# Patient Record
Sex: Female | Born: 1975 | Race: Black or African American | Hispanic: No | Marital: Single | Smoking: Never smoker
Health system: Southern US, Community
[De-identification: ages and names within clinical notes are randomized; demographics above are authoritative.]

## PROBLEM LIST (undated history)

## (undated) DIAGNOSIS — N390 Urinary tract infection, site not specified: Secondary | ICD-10-CM

## (undated) HISTORY — PX: TUBAL LIGATION: SHX77

## (undated) HISTORY — PX: OTHER SURGICAL HISTORY: SHX169

---

## 1997-11-11 ENCOUNTER — Encounter: Admission: RE | Admit: 1997-11-11 | Discharge: 1997-11-11 | Payer: Self-pay | Admitting: Sports Medicine

## 1998-03-21 ENCOUNTER — Inpatient Hospital Stay (HOSPITAL_COMMUNITY): Admission: AD | Admit: 1998-03-21 | Discharge: 1998-03-24 | Payer: Self-pay | Admitting: *Deleted

## 1998-11-05 ENCOUNTER — Emergency Department (HOSPITAL_COMMUNITY): Admission: EM | Admit: 1998-11-05 | Discharge: 1998-11-05 | Payer: Self-pay | Admitting: Emergency Medicine

## 1998-11-05 ENCOUNTER — Encounter: Payer: Self-pay | Admitting: Emergency Medicine

## 2001-06-12 ENCOUNTER — Other Ambulatory Visit: Admission: RE | Admit: 2001-06-12 | Discharge: 2001-06-12 | Payer: Self-pay | Admitting: Obstetrics and Gynecology

## 2001-11-13 ENCOUNTER — Inpatient Hospital Stay (HOSPITAL_COMMUNITY): Admission: AD | Admit: 2001-11-13 | Discharge: 2001-11-16 | Payer: Self-pay | Admitting: Obstetrics and Gynecology

## 2001-11-13 ENCOUNTER — Encounter (INDEPENDENT_AMBULATORY_CARE_PROVIDER_SITE_OTHER): Payer: Self-pay | Admitting: Specialist

## 2001-11-22 ENCOUNTER — Encounter: Payer: Self-pay | Admitting: Emergency Medicine

## 2001-11-22 ENCOUNTER — Inpatient Hospital Stay (HOSPITAL_COMMUNITY): Admission: EM | Admit: 2001-11-22 | Discharge: 2001-11-25 | Payer: Self-pay | Admitting: Emergency Medicine

## 2001-11-23 ENCOUNTER — Encounter: Payer: Self-pay | Admitting: Cardiology

## 2001-11-23 ENCOUNTER — Encounter: Payer: Self-pay | Admitting: *Deleted

## 2001-11-24 ENCOUNTER — Encounter: Payer: Self-pay | Admitting: Family Medicine

## 2001-11-25 ENCOUNTER — Encounter: Payer: Self-pay | Admitting: Family Medicine

## 2001-12-04 ENCOUNTER — Encounter: Admission: RE | Admit: 2001-12-04 | Discharge: 2001-12-04 | Payer: Self-pay | Admitting: Family Medicine

## 2002-06-27 ENCOUNTER — Emergency Department (HOSPITAL_COMMUNITY): Admission: EM | Admit: 2002-06-27 | Discharge: 2002-06-27 | Payer: Self-pay | Admitting: Emergency Medicine

## 2002-11-04 ENCOUNTER — Emergency Department (HOSPITAL_COMMUNITY): Admission: EM | Admit: 2002-11-04 | Discharge: 2002-11-04 | Payer: Self-pay | Admitting: Emergency Medicine

## 2002-11-05 ENCOUNTER — Emergency Department (HOSPITAL_COMMUNITY): Admission: EM | Admit: 2002-11-05 | Discharge: 2002-11-06 | Payer: Self-pay | Admitting: Emergency Medicine

## 2003-10-29 ENCOUNTER — Emergency Department (HOSPITAL_COMMUNITY): Admission: EM | Admit: 2003-10-29 | Discharge: 2003-10-30 | Payer: Self-pay

## 2003-11-10 ENCOUNTER — Ambulatory Visit (HOSPITAL_COMMUNITY): Admission: RE | Admit: 2003-11-10 | Discharge: 2003-11-10 | Payer: Self-pay | Admitting: Internal Medicine

## 2003-11-10 ENCOUNTER — Encounter: Admission: RE | Admit: 2003-11-10 | Discharge: 2003-11-10 | Payer: Self-pay | Admitting: Internal Medicine

## 2003-11-14 ENCOUNTER — Ambulatory Visit (HOSPITAL_COMMUNITY): Admission: RE | Admit: 2003-11-14 | Discharge: 2003-11-14 | Payer: Self-pay | Admitting: Internal Medicine

## 2003-11-20 ENCOUNTER — Encounter: Admission: RE | Admit: 2003-11-20 | Discharge: 2003-11-20 | Payer: Self-pay | Admitting: Internal Medicine

## 2004-02-24 ENCOUNTER — Ambulatory Visit: Payer: Self-pay | Admitting: Internal Medicine

## 2005-07-04 ENCOUNTER — Other Ambulatory Visit: Admission: RE | Admit: 2005-07-04 | Discharge: 2005-07-04 | Payer: Self-pay | Admitting: Family Medicine

## 2005-07-10 ENCOUNTER — Emergency Department (HOSPITAL_COMMUNITY): Admission: EM | Admit: 2005-07-10 | Discharge: 2005-07-11 | Payer: Self-pay | Admitting: Emergency Medicine

## 2005-07-13 ENCOUNTER — Emergency Department (HOSPITAL_COMMUNITY): Admission: EM | Admit: 2005-07-13 | Discharge: 2005-07-13 | Payer: Self-pay | Admitting: Emergency Medicine

## 2005-09-09 IMAGING — CR DG CHEST 2V
2 series · 2 of 2 positions shown · non-contrast
Comparison: none

CLINICAL DATA: Midchest pain. 
 PA AND LATERAL CHEST 
 The patient has taken a shallow inspiration which accentuates the cardiac silhouette.  Pulmonary vascularity is normal and the lungs are clear.  There is a mild thoracic scoliosis. 
 IMPRESSION
 Possible mild cardiomegaly.

[view not recorded (1 of 2)]
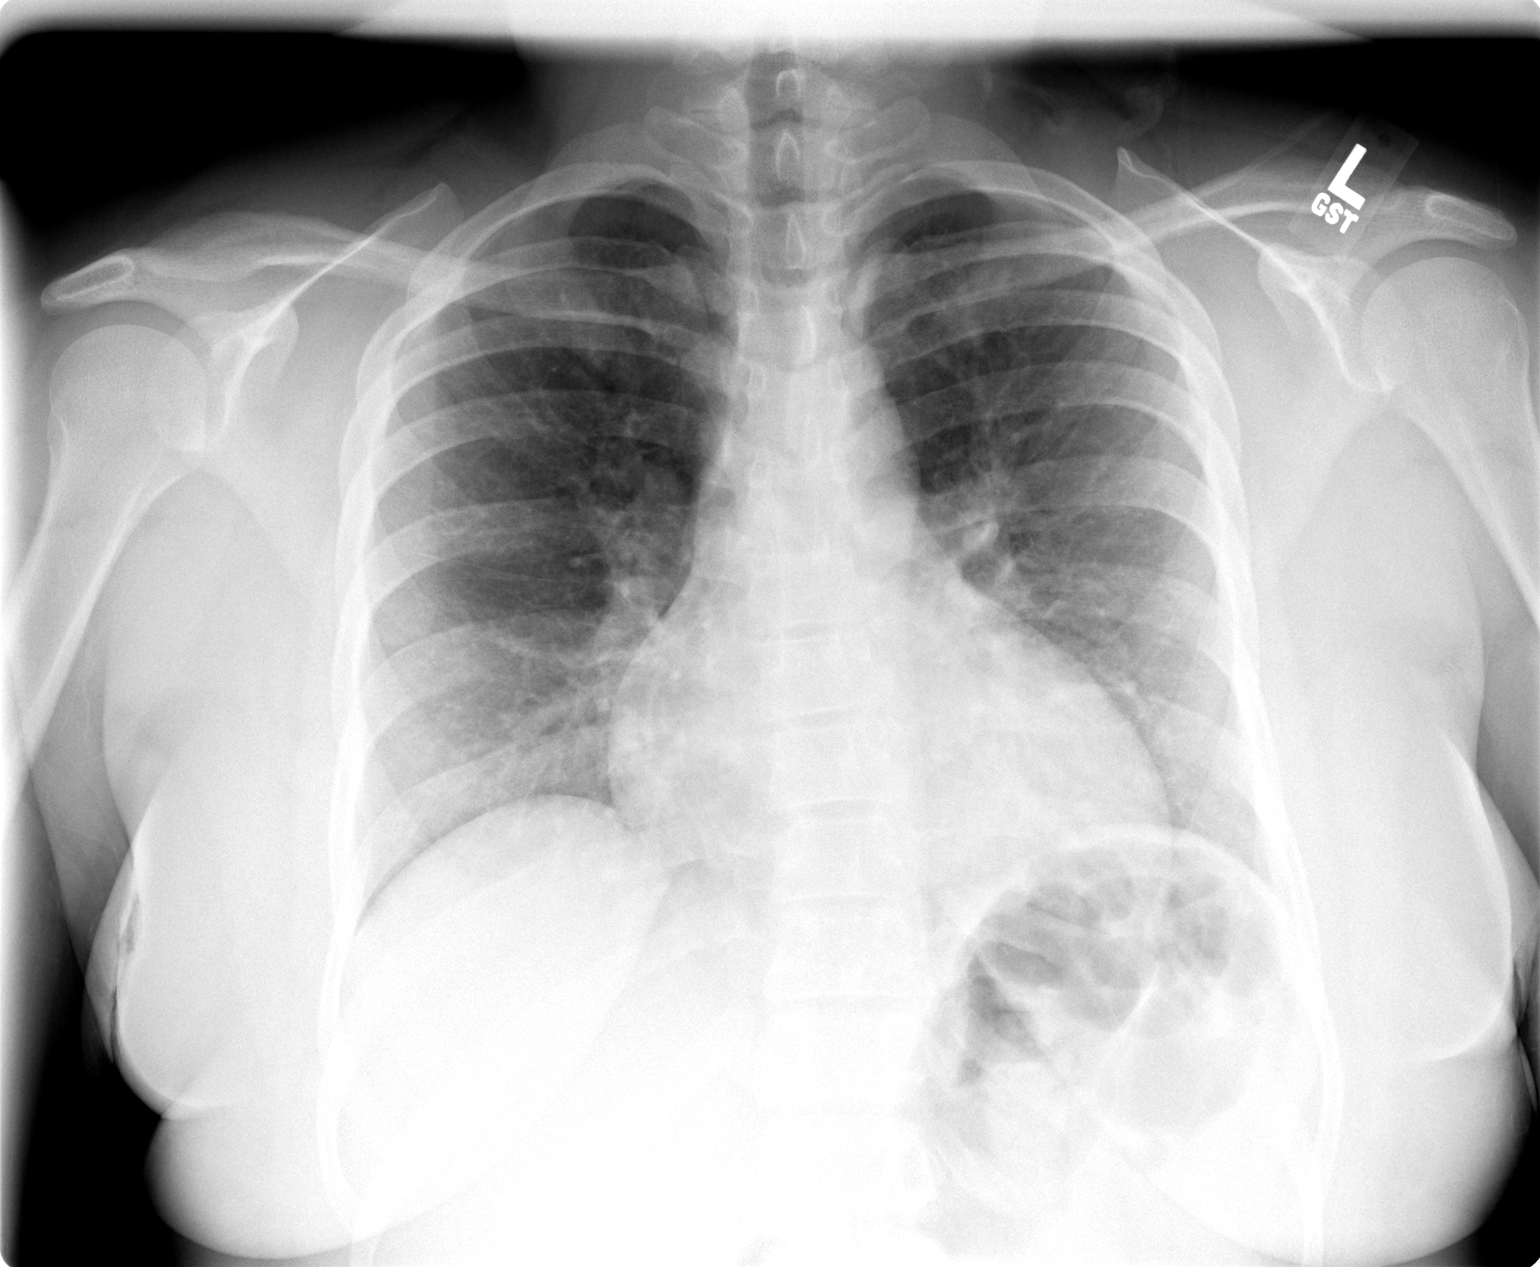

[view not recorded (2 of 2)]
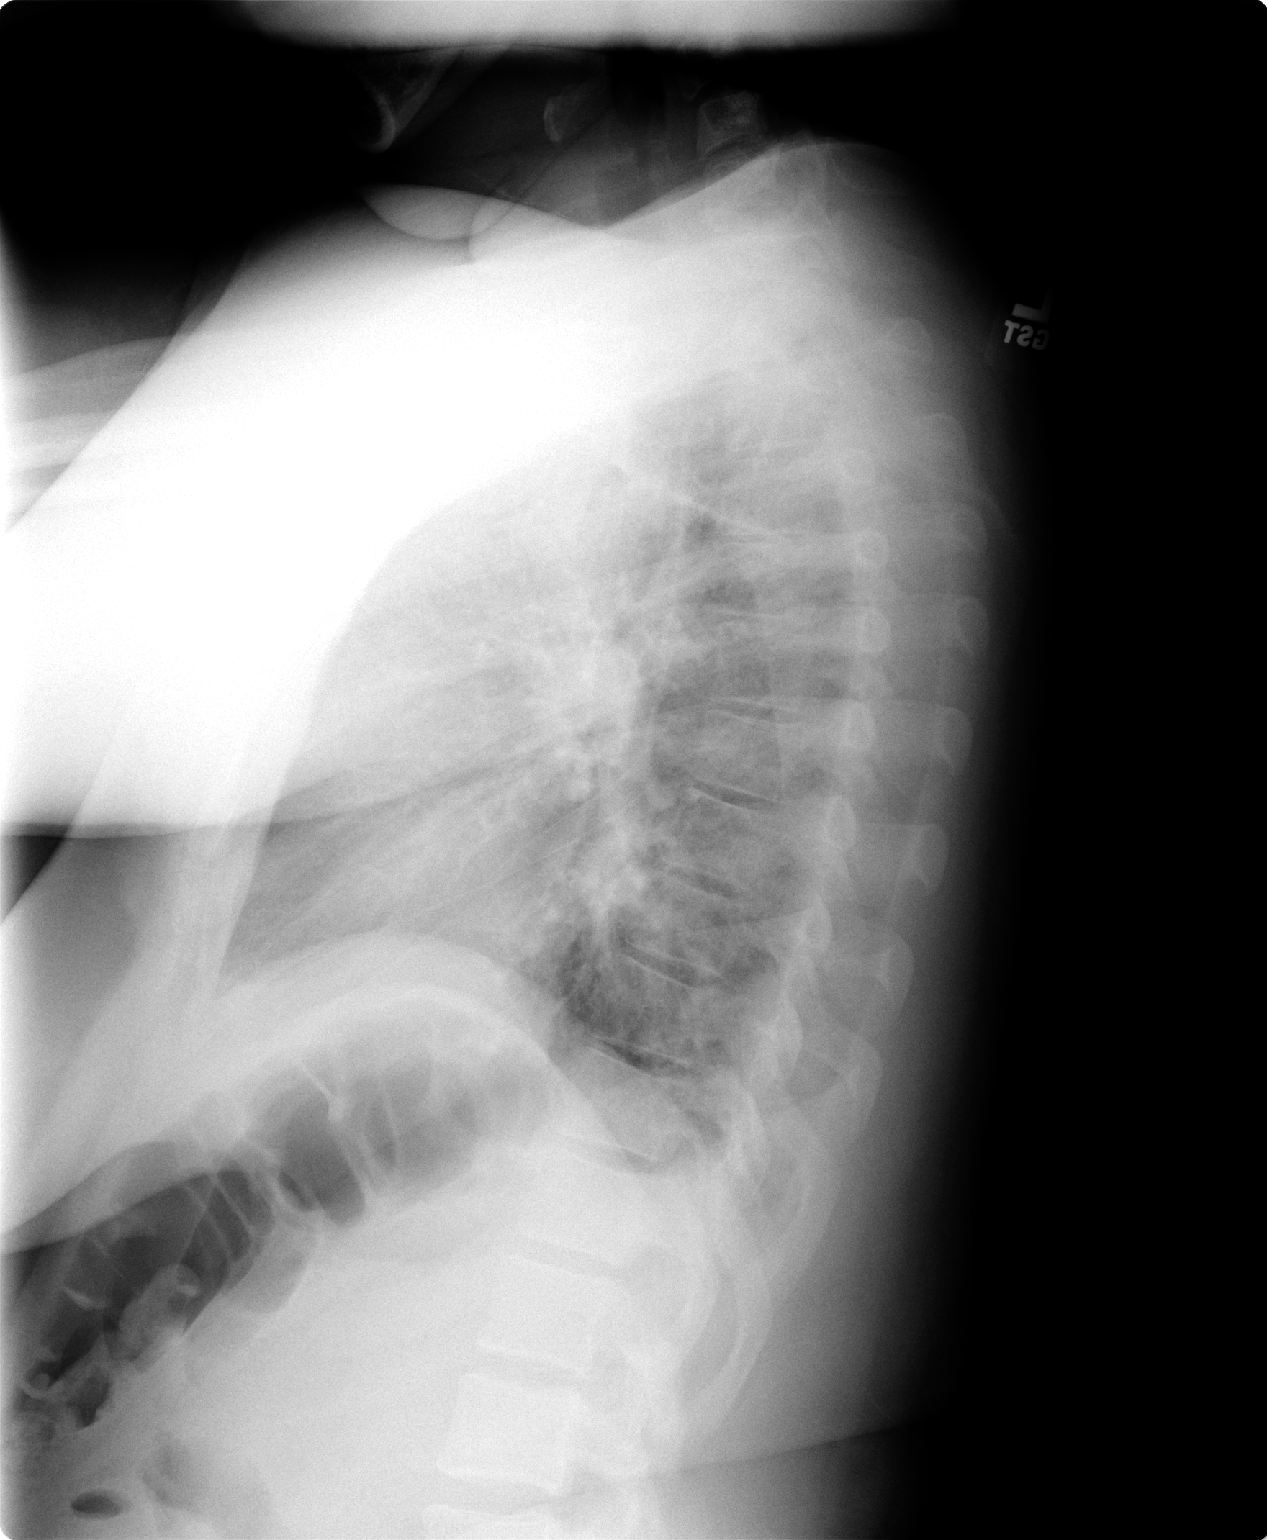

[2 of 2 positions shown; findings below may reference images not displayed]

## 2005-10-12 ENCOUNTER — Emergency Department (HOSPITAL_COMMUNITY): Admission: EM | Admit: 2005-10-12 | Discharge: 2005-10-13 | Payer: Self-pay | Admitting: Emergency Medicine

## 2006-05-15 ENCOUNTER — Emergency Department (HOSPITAL_COMMUNITY): Admission: EM | Admit: 2006-05-15 | Discharge: 2006-05-15 | Payer: Self-pay | Admitting: Emergency Medicine

## 2006-09-11 ENCOUNTER — Ambulatory Visit: Payer: Self-pay | Admitting: Internal Medicine

## 2006-10-30 ENCOUNTER — Ambulatory Visit: Payer: Self-pay | Admitting: Internal Medicine

## 2006-10-30 ENCOUNTER — Encounter: Payer: Self-pay | Admitting: Internal Medicine

## 2007-02-27 DIAGNOSIS — B359 Dermatophytosis, unspecified: Secondary | ICD-10-CM | POA: Insufficient documentation

## 2007-07-06 ENCOUNTER — Ambulatory Visit: Payer: Self-pay | Admitting: Internal Medicine

## 2007-07-06 DIAGNOSIS — N760 Acute vaginitis: Secondary | ICD-10-CM | POA: Insufficient documentation

## 2007-07-06 LAB — CONVERTED CEMR LAB: Whiff Test: NEGATIVE

## 2007-07-11 ENCOUNTER — Encounter (INDEPENDENT_AMBULATORY_CARE_PROVIDER_SITE_OTHER): Payer: Self-pay | Admitting: *Deleted

## 2007-07-11 LAB — CONVERTED CEMR LAB: GC Probe Amp, Genital: NEGATIVE

## 2007-08-09 ENCOUNTER — Emergency Department (HOSPITAL_COMMUNITY): Admission: EM | Admit: 2007-08-09 | Discharge: 2007-08-09 | Payer: Self-pay | Admitting: Emergency Medicine

## 2007-09-27 ENCOUNTER — Telehealth (INDEPENDENT_AMBULATORY_CARE_PROVIDER_SITE_OTHER): Payer: Self-pay | Admitting: Nurse Practitioner

## 2010-05-06 ENCOUNTER — Emergency Department (HOSPITAL_COMMUNITY): Admission: EM | Admit: 2010-05-06 | Discharge: 2009-10-11 | Payer: Self-pay | Admitting: Emergency Medicine

## 2010-08-16 LAB — URINE MICROSCOPIC-ADD ON

## 2010-08-16 LAB — URINALYSIS, ROUTINE W REFLEX MICROSCOPIC
Glucose, UA: NEGATIVE mg/dL
Ketones, ur: NEGATIVE mg/dL
Nitrite: NEGATIVE
Urobilinogen, UA: 1 mg/dL (ref 0.0–1.0)

## 2010-10-15 NOTE — Discharge Summary (Signed)
[place]  Patient:    Shirley Everett, Shirley Everett Visit Number: [id number] MRN: [id number]          Service Type: MED Location: 2000 2030 01 Attending Physician:  Tobin Chad Dictated by:   Nicole Cella, M.D. Admit Date:  11/22/2001 Discharge Date: 11/25/2001   CC:         Nilda Simmer, M.D.  Vanessa P. Pennie Rushing, M.D.   Discharge Summary  DATE OF BIRTH:  [birth year]  DISCHARGE DIAGNOSES: 1. Pulmonary edema, of unknown etiology, ruled out for pulmonary embolus,    postpartum cardiomyopathy, and rate-presenting preeclampsia. 2. Postpartum cesarean section on November 13, 2001, cesarean section done as    an elective repeat. 3. Bilateral tubal ligation done with her cesarean section.  Postoperative    wound infection of cesarean section site.  DISCHARGE MEDICATIONS:  Prenatal vitamins one q.d.  CONSULTING PHYSICIANS: 1. Cardiology, Lenise Herald, M.D.  PROCEDURE: 1. Echocardiogram.  LVEF was normal at 55 to 65%, left ventricular size was    normal.  There was no left ventricular wall thickness or motion    abnormalities.  Aortic valve and mitral valve were normal.  Aortic valve    was trileaflet.  There was read as a mild thickening of the mitral valve.    No noted mitral regurgitation.  Right ventricle normal.  Tricuspid valve    normal.  There was a minimal pericardial effusion without hemodynamic    compromise.  Overall study was read as normal.  This study was read on November 23, 2001. 2. CT of the chest and lower extremities was negative for pulmonary embolus,    although, there was significant pulmonary edema which was read as extensive    central interstitial and air space disease with more focal confluent air    space disease within the left upper lung, suggestive of moderate to severe    pulmonary edema.  There were bilateral pleural effusions.  Lower    extremities were negative for deep venous thromboses. 3. Pulmonary  angiogram.  The patient underwent a pulmonary angiogram which    was negative for pulmonary embolus.  FOLLOW-UP:  The patient is to follow up with Nilda Simmer, M.D.  This appointment is to be made over the phone next week.  HISTORY OF PRESENT ILLNESS:  This is a 35 year old African-American, gravida 2, para 2-0-0-2, who was delivered by elective repeat cesarean section on November 13, 2001, at [facility] [place].  She presented to the [facility] emergency department after being awakened from sleep on the morning of presentation with shortness of breath.  The patient sat upright and said the dyspnea improved, but continued to be somewhat short of breath. This was a sudden onset per the patient with no other complaints.  No fever or chills, no nausea and vomiting, no abdominal pain, no changes in bowel or bladder habits.  The patient is breastfeeding and was seen at her primary OB/GYN Office on June 25 for a wound check.  At that time she was diagnosed with a wound infection and was given a prescription for Keflex and advised to come back in a couple of days for a recheck on the wound.  PAST MEDICAL HISTORY:  Cesarean section x2.  No other serious illnesses or pregnancy complications.  MEDICATIONS:  Ibuprofen and Percocet.  ALLERGIES:  No known drug allergies.  SOCIAL HISTORY:  The patient lives with the father of her baby and  her two children.  She does not work outside the home.  No tobacco, alcohol, or drugs.  FAMILY HISTORY:  Negative for cardiac disease, pulmonary disease, hypertension, or diabetes.  PHYSICAL EXAMINATION:  VITAL SIGNS:  On presentation, the patient had a respiratory rate of 48 and was saturating 94% on 4 liters by nasal cannula. Blood pressure was 166/86, heart rate 66, temperature 99.0.  GENERAL: The patient was sitting upright in bed with shallow, rapid respirations, in mild to moderate respiratory distress.  She was able to speak in  short sentences and appeared tired and pale.  NECK:  No JVD, no bruits, and no thyromegaly. CHEST:  No breath sounds at the bases and bilateral rales most of the way up the patients both lung fields.  ABDOMEN: Soft and nontender.  There were positive bowel sounds.  Incision was without drainage or purulent material, however, the left side of the wound had not completely healed.  EXTREMITIES: 1+ edema, warm, good pulses.  LABORATORY DATA:  Presenting white count 7.4, hemoglobin 10.9, platelets 283. CK 141, MB 0.8, relative index 0.6, troponin I 0.01.  D-dimer was 18.11. Sodium 142, potassium 3.8, chloride 112, bicarb 23, BUN 11, creatinine 0.3, glucose 89, albumin 2.4.  Blood gas showed a pH of 7.45, pCO2 34, and pO2 59. Chest x-ray showed cardiomegaly with pulmonary edema.  CT of chest was as listed above.  At the time, the patient was admitted as acute pulmonary edema, rule out postpartum cardiomyopathy.  HOSPITAL COURSE:  #1 - Pulmonary edema.  Cardiology was initially consulted and agreed that a postpartum cardiomyopathy needed to be ruled out.  Therefore, the patient had an echocardiogram done and a brain naturetic protein which was found to be less than 30.  On the basis of the echocardiogram and the normal BNP it was thought that this was not a postpartum cardiomyopathy, although, fluid retention and a transient diastolic function was mentioned by cardiology as a possible etiology for her pulmonary edema.  It was their recommendation that the patient did not need to be kept on cardiac medications such as Lasix and ACE inhibitor as an outpatient, as this appeared to be a transient phenomena. A preeclampsia labs were done on the patient to rule out the possibility of a late presenting preeclampsia with pulmonary edema, but as the patients liver enzymes were normal, her urine was without protein and her blood pressures  remained normal.  It was felt that this was an unlikely  etiology, although, of note, the patient did have a uric acid level of 8.5 which was elevated.  Her LDH, however, was normal at 241.  Peripheral blood smear also was normal.  The patient was aggressively diuresed over the course of the hospitalization with Lasix and rapidly improved.  On the day of discharge, the patient was not requiring oxygen, was not feeling short of breath, and seemed to have returned to her baseline.  Due to this and the fact that no specific abnormalities had been found, a pulmonary angiogram was done to verify that she did not have a pulmonary embolus contributing to this and as all of these studies were normal, it was decided to discharge her home on no cardiac medications, but with close follow up with primary care physician.  Of note, it should be kept in differential that the patient might have had some sort of mild acute lung injury such as a small amniotic fluid embolus which might have caused her to have some diffuse lung dysfunction  which possibly could have contributed to this picture.  #2 - Wound infection.  The patients primary OB/GYN had written to put her on Keflex 500 mg q.i.d. for a wound infection, but she had not been put on any sort of antibiotics during the course of this hospitalization and the wound seems to be doing well with just topical therapy, so on discharge she was not prescribed any antibiotics.  If the wound does not heal in the next couple of days, I think this would be indicated.  #3 - Postpartum.  The patient was stable postpartum and continued to use breastpump during her hospitalization. Dictated by:   Nicole Cella, M.D. Attending Physician:  Tobin Chad DD:  11/25/01 TD:  11/26/01 Job: [id number]

## 2010-10-15 NOTE — Discharge Summary (Signed)
[facility]  Patient:    Shirley Everett, WOLLEN Visit Number: [id number] MRN: [id number]          Service Type: OBS Location: 910A [id number] [id number] Attending Physician:  Shaune Spittle Dictated by:   Wynelle Bourgeois, CNM Admit Date:  11/13/2001 Discharge Date: 11/16/2001                             Discharge Summary  ADMISSION DIAGNOSES:          1. Term intrauterine pregnancy.                               2. Prior cesarean section desires repeat.                               3. Desires sterilization.  DISCHARGE DIAGNOSES:          1. Term intrauterine pregnancy.                               2. Prior cesarean section desires repeat.                               3. Desires sterilization.  HOSPITAL PROCEDURES:          1. Spinal anesthesia.                               2. Repeat low transverse cesarean section.                               3. Bilateral tubal ligation.  HOSPITAL COURSE:              The patient was admitted for an elective repeat cesarean section and tubal ligation which was performed by Dr. Pennie Rushing on November 13, 2001.  EBL was 1000 cc.  Complication included bleeding from the tubal ligation site bilaterally.  The patient was delivered of a viable female infant weighing 7 pounds 9 ounces, Apgars 9 and 9.  Infant was taken to full term nursery where he continued to do well.  The patient was bottle feeding on postoperative day and vital signs were stable.  Physical exam was within normal limits on postoperative day two and three.  Heart rate was regular. Lungs were clear.  Extremities were within normal limits.  Abdomen was soft with positive bowel sounds.  The patient was passing flatus.  Incision was intact with eversion on the right edge of incision and light drainage. Abdomen was soft and appropriately tender.  Lochia small.  Extremities within normal limits and she was deemed to have received full benefit of her hospital stay.  She  was discharged home on postoperative day three.  DISCHARGE LABORATORY DATA:    White blood cell count 9.7, hemoglobin 9.9, hematocrit 30.0, platelets 174.  RPR nonreactive.  DISCHARGE MEDICATIONS:        1. Tylox one or two p.o. q.4h. p.r.n.  2. Motrin 600 mg p.o. q.6h. p.r.n.  DISCHARGE INSTRUCTIONS:       Per [facility] handout.  DISCHARGE FOLLOW-UP:          In one week for staple removal and then in six weeks. Dictated by:   Wynelle Bourgeois, CNM Attending Physician:  Shaune Spittle DD:  11/16/01 TD:  11/18/01 Job: [id number]

## 2010-10-15 NOTE — H&P (Signed)
NAME:  Shirley Everett, Shirley Everett                             ACCOUNT NO.:   MEDICAL RECORD NO.:                             PATIENT TYPE:   LOCATION:                                       FACILITY:   PHYSICIAN:  Hal Morales, M.D.             DATE OF BIRTH:  25-Nov-1975   DATE OF ADMISSION:  11/13/2001  DATE OF DISCHARGE:                                HISTORY & PHYSICAL   REDICTATION:   HISTORY OF PRESENT ILLNESS:  The patient is a 35 year old gravida 2, para 1  female who is admitted for elective repeat low transverse cesarean section  and bilateral tubal ligation.  She denies any leaking or bleeding and  reports positive fetal movement.  Pregnancy has been followed by the midwife  service and remarkable for (1) history of iron-deficiency anemia, (2)  previous C-section, (3) history of LGA, (4) late care.   PRENATAL LABORATORIES:  Hemoglobin 8.9, platelets 326, blood type A  positive, antibody screen negative, sickle cell negative, RPR nonreactive,  rubella immune, HBsAg negative, HIV nonreactive, Pap smear test normal,  gonorrhea negative, Chlamydia negative, AFP free beta within normal limits,  glucose challenge within normal limits.   PAST OBSTETRICAL HISTORY:  Her OB history is remarkable for a low transverse  cesarean section in 1999 of a female infant at [redacted] weeks gestation weighing 9  pounds 6 ounces for failure to progress.   PAST MEDICAL HISTORY:  Her medical history is remarkable for occasional  yeast and BV, history of iron-deficiency anemia in her first pregnancy.   PAST SURGICAL HISTORY:  Surgical history is remarkable for a cesarean  section in 1999.   FAMILY HISTORY:  Family history is remarkable for a maternal grandmother  with hypertension, brother with asthma and a maternal grandfather with  stroke.   GENETIC HISTORY:  Genetic history is unremarkable.   SOCIAL HISTORY:  The patient is single but involved with Roque Lias who  is involved and supportive.  She  does not report a religious affiliation.  She works as a Tax adviser and denies any alcohol, tobacco, or drug use.   OBJECTIVE DATA:  VITAL SIGNS: Stable, afebrile.  HEENT: Within normal limits.  NECK: Thyroid normal, not enlarged.  BREASTS: Soft, nontender.  CHEST: Clear to auscultation bilaterally.  CARDIAC: Heart rate regular rate and rhythm.  ABDOMEN: Gravid at 38 cm, vertex to [place].  PELVIC: Exam deferred today.  EXTREMITIES: Within normal limits.   ASSESSMENT:  1. Intrauterine pregnancy at term.  2. Prior cesarean section, desires repeat.  3. Desires sterilization.   PLAN:  1. Admit to operating suites.  2. Plan repeat low transverse cesarean section and BTL with Dr. Pennie Rushing.     Marie L. Williams, C.N.M.                 Hal Morales, M.D.    MLW/MEDQ  D:  02/20/2002  T:  02/20/2002  Job:  [id number]

## 2010-10-15 NOTE — Op Note (Signed)
[place]  Patient:    Shirley Everett, Shirley Everett Visit Number: [id number] MRN: [id number]          Service Type: OBS Location: [facility] Attending Physician:  Jaymes Graff A Dictated by:   Maris Berger. Pennie Rushing, M.D. Proc. Date: 11/13/01                             Operative Report  PREOPERATIVE DIAGNOSIS:       Intrauterine pregnancy at term, prior cesarean section, desire for repeat cesarean section, desire for surgical sterilization.  POSTOPERATIVE DIAGNOSIS:      Intrauterine pregnancy at term, prior cesarean section, desire for repeat cesarean section, desire for surgical sterilization.  OPERATION:                    Repeat low transverse cesarean section and bilateral tubal sterilization.  SURGEON:                      Vanessa P. Pennie Rushing, M.D.  ASSISTANT:                    Wynelle Bourgeois, C.N.M.  ANESTHESIA:                   Spinal.  ESTIMATED BLOOD LOSS:         1000 cc.  COMPLICATIONS:                Bleeding from tubal ligation site on both sides which had to be corrected.  FINDINGS:                     The uterus, tubes, and ovaries were normal for the gravid state.  The patient delivered a female infant weighing 7 pounds 9 ounces with Apgars of 9 and 9 at one and five minutes, respectively.  DESCRIPTION OF PROCEDURE:     The patient was taken to the operating room after appropriate identification and placed on the operating table.  After placement of a spinal anesthetic, she was placed in the supine position with a left lateral tilt.  The abdomen and perineum were prepped with multiple layers of Betadine and a Foley catheter inserted into the bladder and connected to straight drainage.  The abdomen was draped as a sterile field and after assurance of adequate anesthesia, a transverse incision made in the abdomen and the abdomen opened in layers.  The peritoneum was entered, the bladder blade was placed, and the uterus incised approximately  2 cm above the uterovesical fold.  That incision was taken laterally with bandage scissors. The infant was delivered from the occipitotransverse position with the aid of a Kiwi vacuum extractor and after having the nares and pharynx suctioned and the cord clamped and cut, was handed off to the awaiting pediatricians.  The appropriate cord blood was drawn and the placenta noted to have separated from the uterus and was removed from the operative field.  The uterine incision was closed with a running interlocking suture of 0 Vicryl.  An imbricating suture of 0 Vicryl was placed for adequate hemostasis.  Copious irrigation was carried out.  The left fallopian tube was identified, followed to its fimbriated end, and then grasped at the isthmic portion after dextrarotating the uterus to allow better visualization.  A suture of 2-0 chromic was placed through the mesosalpinx and tied fore and aft on a knuckle of tube and  a second ligature placed proximal to that.  The intervening knuckle of tube was cut and as it was cut, the upper portion of the remaining tube slipped from the suture and required repeat suturing with 2-0 chromic for adequate hemostasis.  A similar procedure was carried out on the right side again with the upper portion of the tube slipping out of the suture and requiring separate suturing and the intervening mesosalpinx being oversewn with a running interlocking suture of 2-0 chromic.  The cut ends of the tubes were cauterized and once hemostasis was adequate, were allowed to return to the peritoneal cavity.  The portions of tube which had been removed, were removed from the operative field.  Copious irrigation was carried out and the abdominal peritoneum closed with a running suture of 2-0 Vicryl.  The rectus muscles were reapproximated with a figure-of-eight suture of 2-0 Vicryl.  The rectus fascia was closed with a running suture of 0 Vicryl and then reinforced on either side  of the midline with figure-of-eight sutures of 0 Vicryl.  The subcutaneous tissue was made hemostatic with Bovie cautery and irrigated. The skin staples were applied to the skin incision and a sterile dressing applied. The patient was taken from the operating room to the recovery room in satisfactory condition having tolerated the procedure well with sponge, needle, and instrument counts were correct.  The infant went to the fullterm nursery. Dictated by:   Maris Berger. Pennie Rushing, M.D. Attending Physician:  Michael Litter DD:  11/13/01 TD:  11/14/01 Job: [id number]

## 2010-10-15 NOTE — H&P (Signed)
[place]. [facility]  Patient:    Shirley Everett, Shirley Everett Visit Number: [id number] MRN: [id number]          Service Type: MED Location: 2000 2030 01 Attending Physician:  Tobin Chad Dictated by:   Kevin Fenton, M.D. Admit Date:  11/22/2001                           History and Physical  CHIEF COMPLAINT: Shortness of breath.  HISTORY OF PRESENT ILLNESS: The patient is a 35 year old African-American female, G2 P2, 0-0-2, who delivered on November 13, 2001 by repeat elective cesarean section at [facility] [place].  She presents to [employer] Emergency Room today after being awakened from sleep this a.m. with shortness of breath.  Once the patient sat upright she improved but continued with shortness of breath.  Per this patient this was of sudden onset today.  She has some dry cough today.  No other complaints.  No fevers, chills, nausea or vomiting.  No abdominal pain.  No change in bowel or bladder habits.  The patient is breast feeding and was seen at [facility] on November 21, 2001 for a wound check.  The right side of her incision continues to remain open with some drainage.  REVIEW OF SYSTEMS: As above.  PAST SURGICAL HISTORY:  1. Cesarean section x2.  2. Bilateral tubal ligation on November 13, 2001.  PAST MEDICAL HISTORY: No serious illnesses.  Pregnancy had been uncomplicated.  MEDICATIONS:  1. Ibuprofen p.r.n.  2. Percocet p.r.n.  ALLERGIES: No known drug allergies.  SOCIAL HISTORY: She lives with the father of the baby and her two children. Her other son is three.  She does not work outside the home.  No tobacco, alcohol, or drugs.  FAMILY HISTORY: Negative for coronary artery disease, pulmonary disease, hypertension, and diabetes.  PHYSICAL EXAMINATION:  VITAL SIGNS: Respiratory rate 48.  Saturation 94% on 4 liters by nasal cannula.  Heart rate 66.  BP 166/86.  Temperature 99  degrees.  GENERAL: She is sitting upright with shallow rapid breathing.  She is in moderate respiratory distress.  She is able to speak in short sentences only. Her affect is flat.  She appears tired and pale.  HEENT/NECK: No JVD, no bruits, no thyromegaly.  Mucous membranes are moist.  CARDIAC: Heart rate is regular.  There is no murmur.  PMI is diffuse, displaced, with a positive heave.  CHEST: Lungs are without any breath sounds at the bases bilaterally and rales most of the way up.  ABDOMEN: Soft, nontender.  Good bowel sounds.  Incision is without drainage or pus and is well-healing.  EXTREMITIES: Edema 1+.  Warm, good pulses.  LABORATORY DATA: WBC 7.4, hemoglobin 10.9, platelets 283,000; MCV 86.  CK 141, MB 0.8.  Relative index 0.6.  Troponin 0.01.  CMP was unremarkable except for an albumin of 2.4.  INR 0.9.  PT 12.9.  D-dimer 18.11.  UA is pending.  Gas on 4 liters 7.45/34/59.  Chest x-ray shows cardiomegaly with pulmonary edema.  CT showed cardiomegaly with alveolar infiltrates.  No evidence of PE, no evidence of DVT on CT of the lower extremities.  ASSESSMENT/PLAN: This is a 35 year old gravida 2 para 2, postpartum day nine, with acute onset of shortness of breath, cardiomegaly, and pulmonary edema, all consistent with postpartum cardiomyopathy.  Also differential includes pulmonary embolus, although this is less likely given the patients CT scan;  myocardial infarction also and nephrotic syndrome secondary to her low albumin.  We are going to await her urinalysis and if it is positive for protein will check a 24 hour urine.  We will follow her blood pressure as this seems to be elevated here.  We are going to diurese her aggressively with Lasix and follow her potassium and electrolytes.  We are going to put her on the step-down unit and use BiPAP p.r.n.  We are going to check an echocardiogram in the morning to evaluate her ejection fraction.  The patient is breast  feeding and we will get a pump from pediatrics.  Dr. Evern Bio office has been notified and her Hollisters have been retained.  This case has been discussed with Dr. Sheffield Slider. Dictated by:   Kevin Fenton, M.D. Attending Physician:  Tobin Chad DD:  11/22/01 TD:  11/24/01 Job: [id number]

## 2010-10-15 NOTE — H&P (Signed)
NAME:  Shirley Everett, [place] NO.:   MEDICAL RECORD NO.:  [id number]                  PATIENT TYPE:   LOCATION:                                       FACILITY:   PHYSICIAN:  Hal Morales, M.D.             DATE OF BIRTH:   DATE OF ADMISSION:  11/13/2001  DATE OF DISCHARGE:                                HISTORY & PHYSICAL   HISTORY OF PRESENT ILLNESS:  The patient is a 35 year old black female  gravida 2 para 1 who presents for elective repeat cesarean section.  Her  last menstrual period began on January 07, 2002 and her [facility] by ultrasound was  November 14, 2001.  Her pregnancy was complicated only by history of previous  cesarean section with a desire for repeat, history of large-for-gestational  age infant, late to care for this pregnancy, and iron deficiency anemia.  Prenatal laboratory studies revealed blood type A positive, antibody screen  negative.  Sickle cell trait negative.  VDRL negative.  Rubella immune.  HBsAg negative.  HIV nonreactive. GC and chlamydia negative.  One-hour  Glucola was 100 at [redacted] weeks gestation and maternal serum AFP was within  normal limits.   The patient also had a desire for tubal sterilization and signed her tubal  papers approximately one month prior to the date of her cesarean section.   PAST MEDICAL HISTORY:   OBSTETRICAL:  The patient had a primary low transverse cesarean section in  October 1999 for failure to progress in labor, delivering a 9 pound 6 ounce  female infant at [redacted] weeks gestation after approximately eight hours of labor.   GYNECOLOGICAL:  The patient has used condoms and withdrawal in the past for  contraception and has had occasional yeast infections and occasional BV.   GENERAL MEDICAL:  History of anemia and a history of varicella at age 67.   FAMILY HISTORY:  Positive for chronic hypertension in her maternal  grandmother, and asthma in her brother, as well as a CVA in her maternal  grandfather.   REVIEW OF SYSTEMS:  Negative except for complaints specific to later  pregnancy.   CURRENT MEDICATIONS:  Hematinic F tablets one p.o. b.i.d.   DRUG ALLERGIES:  None known.   PHYSICAL EXAMINATION:  GENERAL:  The patient is a gravid black female in no  acute distress.  VITAL SIGNS:  Temperature 97.8, pulse 80, respirations 18, blood pressure  117/66.  LUNGS:  Clear.  HEART:  Regular rate and rhythm.  ABDOMEN:  Gravid with a 38 cm fundus.  EXTREMITIES:  No clubbing, cyanosis, or edema.   IMPRESSION:  1. Intrauterine pregnancy at term.  2. History of prior cesarean section with desire for repeat.  3. Iron deficiency anemia.  4. Desire for surgical sterilization.   DISPOSITION:  The patient is admitted for elective repeat cesarean section  and tubal sterilization.  A discussion has been held with  the patient  concerning the indications for and the risks involved in her cesarean  section which include but are not limited to anesthesia, bleeding,  infection, and damage to adjacent organs.  There is likewise the risk of  tubal failure and subsequent pregnancy.  The patient verbalizes  understanding and wishes to proceed at [facility] on November 13, 2001.                                                Hal Morales, M.D.    VPH/MEDQ  D:  02/20/2002  T:  02/20/2002  Job:  [phone] [id number]

## 2011-02-21 LAB — STREP A DNA PROBE

## 2011-02-21 LAB — RAPID STREP SCREEN (MED CTR MEBANE ONLY): Streptococcus, Group A Screen (Direct): NEGATIVE

## 2011-04-13 ENCOUNTER — Encounter: Payer: Self-pay | Admitting: Emergency Medicine

## 2011-04-13 ENCOUNTER — Emergency Department (HOSPITAL_COMMUNITY)
Admission: EM | Admit: 2011-04-13 | Discharge: 2011-04-14 | Disposition: A | Discharge status: Home / Self Care | Payer: Self-pay | Attending: Emergency Medicine | Admitting: Emergency Medicine

## 2011-04-13 DIAGNOSIS — R350 Frequency of micturition: Secondary | ICD-10-CM | POA: Insufficient documentation

## 2011-04-13 DIAGNOSIS — R300 Dysuria: Secondary | ICD-10-CM | POA: Insufficient documentation

## 2011-04-13 DIAGNOSIS — N390 Urinary tract infection, site not specified: Secondary | ICD-10-CM

## 2011-04-13 HISTORY — DX: Urinary tract infection, site not specified: N39.0

## 2011-04-13 LAB — URINALYSIS, ROUTINE W REFLEX MICROSCOPIC
Glucose, UA: NEGATIVE mg/dL
Protein, ur: 30 mg/dL — AB
pH: 7.5 (ref 5.0–8.0)

## 2011-04-13 LAB — GLUCOSE, CAPILLARY: Glucose-Capillary: 92 mg/dL (ref 70–99)

## 2011-04-13 LAB — URINE MICROSCOPIC-ADD ON

## 2011-04-13 MED ORDER — FLUCONAZOLE 200 MG PO TABS: 200.0000 mg | ORAL_TABLET | Freq: Every day | ORAL | Status: AC

## 2011-04-13 MED ORDER — PHENAZOPYRIDINE HCL 200 MG PO TABS: 200.0000 mg | ORAL_TABLET | Freq: Three times a day (TID) | ORAL | Status: AC

## 2011-04-13 MED ORDER — SULFAMETHOXAZOLE-TRIMETHOPRIM 800-160 MG PO TABS: 1.0000 | ORAL_TABLET | Freq: Two times a day (BID) | ORAL | Status: AC

## 2011-04-13 NOTE — ED Provider Notes (Signed)
History     CSN: [id number] Arrival date & time: 04/13/2011  7:34 PM   First MD Initiated Contact with Patient 04/13/11 2151      Chief Complaint  Patient presents with  . Dysuria    (Consider location/radiation/quality/duration/timing/severity/associated sxs/prior treatment) HPI Comments: Patient here with pressure in her bladder when she urinates - denies pregnancy, vaginal discharge or abdominal pain  Patient is a 35 y.o. female presenting with dysuria. The history is provided by the patient. No language interpreter was used.  Dysuria  This is a new problem. The current episode started yesterday. The problem occurs every urination. The problem has not changed since onset.The quality of the pain is described as burning. The pain is at a severity of 2/10. The pain is mild. There has been no fever. She is sexually active. There is no history of pyelonephritis. Associated symptoms include frequency. Pertinent negatives include no chills, no vomiting, no discharge, no hematuria, no hesitancy, no possible pregnancy, no urgency and no flank pain. She has tried nothing for the symptoms. Her past medical history does not include recurrent UTIs or urinary stasis.    Past Medical History  Diagnosis Date  . UTI (lower urinary tract infection)     Past Surgical History  Procedure Date  . Caesarean section   . Tubal ligation     No family history on file.  History  Substance Use Topics  . Smoking status: Never Smoker   . Smokeless tobacco: Not on file  . Alcohol Use: No    OB History    Grav Para Term Preterm Abortions TAB SAB Ect Mult Living                  Review of Systems  Constitutional: Negative for chills.  Gastrointestinal: Negative for vomiting.  Genitourinary: Positive for dysuria and frequency. Negative for hesitancy, urgency, hematuria and flank pain.  All other systems reviewed and are negative.    Allergies  Review of patient's allergies indicates no known  allergies.  Home Medications  No current outpatient prescriptions on file.  BP 122/57  Pulse 74  Temp(Src) 98 F (36.7 C) (Oral)  SpO2 99%  LMP 04/03/2011  Physical Exam  Constitutional: She is oriented to person, place, and time. She appears well-developed and well-nourished.  HENT:  Head: Normocephalic and atraumatic.  Right Ear: External ear normal.  Left Ear: External ear normal.  Mouth/Throat: Oropharynx is clear and moist.  Eyes: Conjunctivae are normal. Pupils are equal, round, and reactive to light.  Neck: Normal range of motion. Neck supple.  Cardiovascular: Normal rate, regular rhythm and normal heart sounds.   Pulmonary/Chest: Effort normal and breath sounds normal.  Abdominal: Soft. Bowel sounds are normal. She exhibits no distension. There is no tenderness.  Musculoskeletal: Normal range of motion.  Neurological: She is alert and oriented to person, place, and time.  Skin: Skin is warm and dry.  Psychiatric: She has a normal mood and affect. Her behavior is normal. Judgment and thought content normal.    ED Course  Procedures (including critical care time)  Labs Reviewed  URINALYSIS, ROUTINE W REFLEX MICROSCOPIC - Abnormal; Notable for the following:    Appearance CLOUDY (*)    Protein, ur 30 (*)    Leukocytes, UA LARGE (*)    All other components within normal limits  POCT PREGNANCY, URINE  URINE MICROSCOPIC-ADD ON  GLUCOSE, CAPILLARY  POCT PREGNANCY, URINE  POCT CBG MONITORING   No results found.   UTI  MDM  UTI without evidence of pyelonephritis - no abdominal pain or vaginal discharge.        Izola Price [place], [place] 04/13/11 2352

## 2011-04-13 NOTE — ED Notes (Signed)
PT. REPORTS DYSURIA  X1 WEEK , DENIES HEMATURIA , SLIGHT VAGINAL DISCHARGE , DENIES INJURY.

## 2011-04-14 NOTE — ED Provider Notes (Signed)
History/physical exam/procedure(s) were performed by non-physician practitioner and as supervising physician I was immediately available for consultation/collaboration. I have reviewed all notes and am in agreement with care and plan.   Isidor Bromell S Erikah Thumm, MD 04/14/11 1514 

## 2013-07-15 ENCOUNTER — Emergency Department (HOSPITAL_COMMUNITY)
Admission: EM | Admit: 2013-07-15 | Discharge: 2013-07-15 | Disposition: A | Discharge status: Home / Self Care | Payer: Self-pay | Attending: Emergency Medicine | Admitting: Emergency Medicine

## 2013-07-15 ENCOUNTER — Encounter (HOSPITAL_COMMUNITY): Payer: Self-pay | Admitting: Emergency Medicine

## 2013-07-15 DIAGNOSIS — M62838 Other muscle spasm: Secondary | ICD-10-CM | POA: Insufficient documentation

## 2013-07-15 DIAGNOSIS — Z79899 Other long term (current) drug therapy: Secondary | ICD-10-CM | POA: Insufficient documentation

## 2013-07-15 DIAGNOSIS — Z87440 Personal history of urinary (tract) infections: Secondary | ICD-10-CM | POA: Insufficient documentation

## 2013-07-15 MED ORDER — NAPROXEN 500 MG PO TABS: 500.0000 mg | ORAL_TABLET | Freq: Two times a day (BID) | ORAL | Status: AC

## 2013-07-15 MED ORDER — METHOCARBAMOL 500 MG PO TABS: 500.0000 mg | ORAL_TABLET | Freq: Two times a day (BID) | ORAL | Status: AC

## 2013-07-15 NOTE — ED Notes (Signed)
Per pt, states she started having back spasms 3 days ago-took Aunts medication and got minimal relief

## 2013-07-15 NOTE — Discharge Instructions (Signed)
SEEK IMMEDIATE MEDICAL ATTENTION IF: New numbness, tingling, weakness, or problem with the use of your arms or legs.  Severe back pain not relieved with medications.  Change in bowel or bladder control.  Increasing pain in any areas of the body (such as chest or abdominal pain).  Shortness of breath, dizziness or fainting.  Nausea (feeling sick to your stomach), vomiting, fever, or sweats.  Heat Therapy Heat therapy can help ease achy, tense, stiff, and tight muscles and joints. Heat should not be used on new injuries. Wait at least 48 hours after the injury before using heat therapy. Heat also should not be used for discomfort or pain that occurs right after doing an activity. If you still have pain or stiffness 3 hours after finishing the activity, then heat therapy may be used. PRECAUTIONS  High heat or prolonged exposure to heat can cause burns. Be careful when using heat therapy to avoid burning your skin. If you have any of the following conditions, do not use heat until you have discussed heat therapy with your caregiver:  Poor circulation.  Healing wounds or scarred skin in the area being treated.  Diabetes, heart disease, or high blood pressure.  Numbness of the area being treated.  Unusual swelling of the area being treated.  Active infections.  Blood clots.  Cancer.  Inability to communicate your response to pain. This can include young children and people with dementia. HOME CARE INSTRUCTIONS Moist heat pack  Soak a clean towel in warm water, and squeeze out the extra water. The water temperature should be comfortable to the skin.  Put the warm, wet towel in a plastic bag.  Place a thin, dry towel between your skin and the bag.  Put the heat pack on the area for 5 minutes, and check your skin. Your skin may be pink, but it should not be red.  Leave the heat pack on the area for a total of 15 to 30 minutes.  Repeat this every 2 to 4 hours while awake. Do not use  heat while you are sleeping. Warm water bath  Fill a tub with warm water. The water temperature should be comfortable to the skin.  Place the affected body part in the tub.  Soak the area for 20 to 40 minutes.  Repeat as needed. Hot water bottle  Fill the water bottle half full with hot water.  Press out the extra air. Close the cap tightly.  Place a dry towel between your skin and the bottle.  Put the bottle on the area for 5 minutes, and check your skin. Your skin may be pink, but it should not be red.  Leave the bottle on the area for a total of 15 to 30 minutes.  Repeat this every 2 to 4 hours while awake. Electric heating pad  Place a dry towel between your skin and the heating pad.  Set the heating pad on low heat.  Put the heating pad on the area for 10 minutes, and check your skin. Your skin may be pink, but it should not be red.  Leave the heating pad on the area for a total of 20 to 40 minutes.  Repeat this every 2 to 4 hours while awake.  Do not lie on the heating pad.  Do not fall asleep while using the heating pad.  Do not use the heating pad near water. Contact with water can result in an electrical shock. SEEK MEDICAL CARE IF:  You have  blisters, redness, swelling, or numbness.  You have any new problems.  Your problems are getting worse.  You have any questions or concerns. If you develop any problems, stop using heat therapy until you see your caregiver. MAKE SURE YOU:  Understand these instructions.  Will watch your condition.  Will get help right away if you are not doing well or get worse. Document Released: 08/08/2011 Document Reviewed: 08/08/2011 [facility] Patient Information 2014 [place], [place].  Muscle Cramps and Spasms Muscle cramps and spasms occur when a muscle or muscles tighten and you have no control over this tightening (involuntary muscle contraction). They are a common problem and can develop in any muscle. The most common  place is in the calf muscles of the leg. Both muscle cramps and muscle spasms are involuntary muscle contractions, but they also have differences:   Muscle cramps are sporadic and painful. They may last a few seconds to a quarter of an hour. Muscle cramps are often more forceful and last longer than muscle spasms.  Muscle spasms may or may not be painful. They may also last just a few seconds or much longer. CAUSES  It is uncommon for cramps or spasms to be due to a serious underlying problem. In many cases, the cause of cramps or spasms is unknown. Some common causes are:   Overexertion.   Overuse from repetitive motions (doing the same thing over and over).   Remaining in a certain position for a long period of time.   Improper preparation, form, or technique while performing a sport or activity.   Dehydration.   Injury.   Side effects of some medicines.   Abnormally low levels of the salts and ions in your blood (electrolytes), especially potassium and calcium. This could happen if you are taking water pills (diuretics) or you are pregnant.  Some underlying medical problems can make it more likely to develop cramps or spasms. These include, but are not limited to:   Diabetes.   Parkinson disease.   Hormone disorders, such as thyroid problems.   Alcohol abuse.   Diseases specific to muscles, joints, and bones.   Blood vessel disease where not enough blood is getting to the muscles.  HOME CARE INSTRUCTIONS   Stay well hydrated. Drink enough water and fluids to keep your urine clear or pale yellow.  It may be helpful to massage, stretch, and relax the affected muscle.  For tight or tense muscles, use a warm towel, heating pad, or hot shower water directed to the affected area.  If you are sore or have pain after a cramp or spasm, applying ice to the affected area may relieve discomfort.  Put ice in a plastic bag.  Place a towel between your skin and the  bag.  Leave the ice on for 15-20 minutes, 03-04 times a day.  Medicines used to treat a known cause of cramps or spasms may help reduce their frequency or severity. Only take over-the-counter or prescription medicines as directed by your caregiver. SEEK MEDICAL CARE IF:  Your cramps or spasms get more severe, more frequent, or do not improve over time.  MAKE SURE YOU:   Understand these instructions.  Will watch your condition.  Will get help right away if you are not doing well or get worse. Document Released: 11/05/2001 Document Revised: 09/10/2012 Document Reviewed: 05/02/2012 [facility] CenterExitCare Patient Information 2014 [place], [place].

## 2013-07-15 NOTE — ED Provider Notes (Signed)
CSN: [id number]     Arrival date & time 07/15/13  1419 History  This chart was scribed for non-physician practitioner, Katherina MiresAbigail Elissa Grieshop-PA, working with Rolland PorterMark James, MD by Smiley HousemanFallon Davis, ED Scribe. This patient was seen in room WTR6/WTR6 and the patient's care was started at 4:15 PM.    Chief Complaint  Patient presents with  . back spasms    The history is provided by the patient. No language interpreter was used.   HPI Comments: Shirley Everett is a 38 y.o. female who presents to the Emergency Department complaining of back spasms located on her left side under her shoulder blade that started about three days ago.  Pt states her pain is a 6 out of 10 when she is sitting, but when a contraction occurs the pain rises to an 8 out of 10.  She denies pain radiating into her neck and pain when she rotates her neck.  Pt reports pain is worse when she raises her arms.  She states the pain is relieved when pressure is applied to the area.  She states she tried her aunts medicine with some relief.  Pt denies numbness or tingling in her extremities.  She denies known injury to the area.  Pt denies bowel or bladder incontinence, rashes, fevers, and chills.  [place] could have caused the spasms.    Past Medical History  Diagnosis Date  . UTI (lower urinary tract infection)    Past Surgical History  Procedure Laterality Date  . Caesarean section    . Tubal ligation     No family history on file. History  Substance Use Topics  . Smoking status: Never Smoker   . Smokeless tobacco: Not on file  . Alcohol Use: No   OB History   Grav Para Term Preterm Abortions TAB SAB Ect Mult Living                 Review of Systems  Constitutional: Negative for fever and chills.  Respiratory: Negative for cough and shortness of breath.   Cardiovascular: Negative for chest pain.  Gastrointestinal: Negative for nausea, vomiting, abdominal pain and diarrhea.   Musculoskeletal: Positive for back pain. Negative for arthralgias, myalgias and neck pain.  Skin: Negative for color change and rash.  Neurological: Negative for syncope, weakness, numbness and headaches.  Psychiatric/Behavioral: Negative for behavioral problems and confusion.  All other systems reviewed and are negative.   Allergies  Review of patient's allergies indicates no known allergies.  Home Medications   Current Outpatient Rx  Name  Route  Sig  Dispense  Refill  . PRESCRIPTION MEDICATION   Oral   Take 1 tablet by mouth daily.          Triage Vitals: BP 120/70  Pulse 96  Temp(Src) 99.4 F (37.4 C) (Oral)  Resp 16  SpO2 97%  LMP 06/25/2013  Physical Exam  Nursing note and vitals reviewed. Constitutional: She is oriented to person, place, and time. She appears well-developed and well-nourished. No distress.  HENT:  Head: Normocephalic and atraumatic.  Eyes: Conjunctivae and EOM are normal. Right eye exhibits no discharge. Left eye exhibits no discharge.  Neck: Neck supple. No tracheal deviation present.  Cardiovascular: Normal rate.   Pulmonary/Chest: Effort normal. No respiratory distress.  Musculoskeletal: Normal range of motion. She exhibits tenderness.  No midline tenderness.  Palpable spasms in the lower fibers of trapezius muscles.    Neurological: She is alert and  oriented to person, place, and time.  Skin: Skin is warm and dry. No rash noted.  Psychiatric: She has a normal mood and affect. Her behavior is normal. Judgment and thought content normal.    ED Course  Procedures (including critical care time) DIAGNOSTIC STUDIES: Oxygen Saturation is 97% on RA, normal by my interpretation.    COORDINATION OF CARE: 4:25 PM-Patient informed of current plan of treatment and evaluation and agrees with plan.    Labs Review Labs Reviewed - No data to display Imaging Review No results found.  EKG Interpretation   None       MDM   Final diagnoses:   Trapezius muscle spasm    Patient with back pain.  No neurological deficits and normal neuro exam.  Patient can walk but states is painful.  No loss of bowel or bladder control.  No concern for cauda equina.  No fever, night sweats, weight loss, h/o cancer, IVDU.  RICE protocol and pain medicine indicated and discussed with patient.   I personally performed the services described in this documentation, which was scribed in my presence. The recorded information has been reviewed and is accurate.      Arthor Captain, PA-C 07/16/13 [phone]

## 2013-07-19 NOTE — ED Provider Notes (Signed)
Medical screening examination/treatment/procedure(s) were performed by non-physician practitioner and as supervising physician I was immediately available for consultation/collaboration.  EKG Interpretation   None         Torrey Horseman, MD 07/19/13 0936 

## 2023-03-15 ENCOUNTER — Emergency Department (HOSPITAL_COMMUNITY)
Admission: EM | Admit: 2023-03-15 | Discharge: 2023-03-15 | Discharge status: Against Medical Advice | Payer: BC Managed Care – PPO | Attending: Emergency Medicine | Admitting: Emergency Medicine

## 2023-03-15 ENCOUNTER — Other Ambulatory Visit: Payer: Self-pay

## 2023-03-15 ENCOUNTER — Encounter (HOSPITAL_COMMUNITY): Payer: Self-pay | Admitting: *Deleted

## 2023-03-15 DIAGNOSIS — R300 Dysuria: Secondary | ICD-10-CM | POA: Diagnosis present

## 2023-03-15 DIAGNOSIS — N390 Urinary tract infection, site not specified: Secondary | ICD-10-CM | POA: Insufficient documentation

## 2023-03-15 DIAGNOSIS — Z5321 Procedure and treatment not carried out due to patient leaving prior to being seen by health care provider: Secondary | ICD-10-CM | POA: Insufficient documentation

## 2023-03-15 LAB — URINALYSIS, ROUTINE W REFLEX MICROSCOPIC
Bilirubin Urine: NEGATIVE
Glucose, UA: NEGATIVE mg/dL
Hgb urine dipstick: NEGATIVE
Ketones, ur: NEGATIVE mg/dL
Nitrite: NEGATIVE
Protein, ur: 30 mg/dL — AB
Specific Gravity, Urine: 1.027 (ref 1.005–1.030)
pH: 5 (ref 5.0–8.0)

## 2023-03-15 NOTE — ED Notes (Signed)
Was called by previous triage RN no answer. Moved OTF by [employer] RN

## 2023-03-15 NOTE — ED Triage Notes (Signed)
Pt has been having urinary symptoms for over a week, had to force urine out, some burning, darker than normal as well as spotting.

## 2023-03-17 ENCOUNTER — Ambulatory Visit (HOSPITAL_COMMUNITY)
Admission: RE | Admit: 2023-03-17 | Discharge: 2023-03-17 | Disposition: A | Discharge status: Home / Self Care | Payer: BC Managed Care – PPO | Source: Ambulatory Visit | Attending: Emergency Medicine | Admitting: Emergency Medicine

## 2023-03-17 ENCOUNTER — Encounter (HOSPITAL_COMMUNITY): Payer: Self-pay

## 2023-03-17 VITALS — BP 120/77 | HR 86 | Temp 98.3°F | Resp 18 | Ht 66.0 in | Wt 270.0 lb

## 2023-03-17 DIAGNOSIS — N9089 Other specified noninflammatory disorders of vulva and perineum: Secondary | ICD-10-CM | POA: Diagnosis present

## 2023-03-17 LAB — POCT URINALYSIS DIP (MANUAL ENTRY)
Bilirubin, UA: NEGATIVE
Glucose, UA: NEGATIVE mg/dL
Ketones, POC UA: NEGATIVE mg/dL
Nitrite, UA: NEGATIVE
Protein Ur, POC: NEGATIVE mg/dL
Spec Grav, UA: 1.02 (ref 1.010–1.025)
Urobilinogen, UA: 0.2 U/dL
pH, UA: 7 (ref 5.0–8.0)

## 2023-03-17 MED ORDER — FLUCONAZOLE 150 MG PO TABS: 150.0000 mg | ORAL_TABLET | Freq: Every day | ORAL | 0 refills | Status: AC

## 2023-03-17 NOTE — Discharge Instructions (Signed)
I also recommend purchasing miconazole vaginal cream - used for vaginal yeast infections - and using the cream on the areas that are irritated as needed.   You will get a call if tests are positive, you will not get a call if tests are negative but you can check results in MyChart if you have a MyChart account.

## 2023-03-17 NOTE — ED Triage Notes (Signed)
Urinary symptoms onset 2 weeks. Patient having burning with urination, urinary urgency, and back pain.   Patient has not taken any meds for her symptoms.

## 2023-03-17 NOTE — ED Provider Notes (Signed)
[facility]    CSN: [id number] Arrival date & time: 03/17/23  4782      History   Chief Complaint Chief Complaint  Patient presents with   Appointment   Urinary Tract Infection    HPI Shirley Everett is a 47 y.o. female. She reports 2 weeks of burning with urination. Also feels burning sensation when not urinating. Thinks she is voiding more frequently but feels like urine volumes are normal or larger, not small amounts. Denies vaginal discharge. Could be at risk for STIs. Does have some back pain in last few days, is lower back. Denies abd pain, n/v, fever. Last period was end of September, not due for her period this month yet.    Urinary Tract Infection   Past Medical History:  Diagnosis Date   UTI (lower urinary tract infection)     Patient Active Problem List   Diagnosis Date Noted   Vaginitis and vulvovaginitis 07/06/2007   Dermatophytosis 02/27/2007    Past Surgical History:  Procedure Laterality Date   CAESAREAN SECTION     TUBAL LIGATION      OB History   No obstetric history on file.      Home Medications    Prior to Admission medications   Medication Sig Start Date End Date Taking? Authorizing Provider  fluconazole (DIFLUCAN) 150 MG tablet Take 1 tablet (150 mg total) by mouth daily. 03/17/23  Yes Cathlyn Parsons, NP  methocarbamol (ROBAXIN) 500 MG tablet Take 1 tablet (500 mg total) by mouth 2 (two) times daily. 07/15/13   Arthor Captain, PA-C  naproxen (NAPROSYN) 500 MG tablet Take 1 tablet (500 mg total) by mouth 2 (two) times daily with a meal. 07/15/13   Arthor Captain, PA-C  PRESCRIPTION MEDICATION Take 1 tablet by mouth daily.    [provider]    Family History History reviewed. No pertinent family history.  Social History Social History   Tobacco Use   Smoking status: Never  Substance Use Topics   Alcohol use: No   Drug use: No     Allergies   Patient has no known allergies.   Review of Systems Review  of Systems   Physical Exam Triage Vital Signs ED Triage Vitals  Encounter Vitals Group     BP 03/17/23 0906 120/77     Systolic BP Percentile --      Diastolic BP Percentile --      Pulse Rate 03/17/23 0906 86     Resp 03/17/23 0906 18     Temp 03/17/23 0906 98.3 F (36.8 C)     Temp Source 03/17/23 0906 Oral     SpO2 03/17/23 0906 96 %     Weight 03/17/23 0906 270 lb (122.5 kg)     Height 03/17/23 0906 5\' 6"  (1.676 m)     Head Circumference --      Peak Flow --      Pain Score 03/17/23 0904 8     Pain Loc --      Pain Education --      Exclude from Growth Chart --    No data found.  Updated Vital Signs BP 120/77 (BP Location: Left Arm)   Pulse 86   Temp 98.3 F (36.8 C) (Oral)   Resp 18   Ht 5\' 6"  (1.676 m)   Wt 270 lb (122.5 kg)   LMP 02/26/2023 (Exact Date)   SpO2 96%   BMI 43.58 kg/m   Visual Acuity Right Eye  Distance:   Left Eye Distance:   Bilateral Distance:    Right Eye Near:   Left Eye Near:    Bilateral Near:     Physical Exam Exam conducted with a chaperone present.  Constitutional:      Appearance: Normal appearance.  Pulmonary:     Effort: Pulmonary effort is normal.  Genitourinary:    Exam position: Lithotomy position.     Labia:        Right: No rash or lesion.        Left: No rash or lesion.      Comments: External vulva and inner labia without erythema. No signficant discharge seen. Location of 'burning feeling' is inner labia and vaginal introitus; these areas do not look inflamed or irritated visually Lymphadenopathy:     Lower Body: No right inguinal adenopathy. No left inguinal adenopathy.  Neurological:     Mental Status: She is alert.      UC Treatments / Results  Labs (all labs ordered are listed, but only abnormal results are displayed) Labs Reviewed  POCT URINALYSIS DIP (MANUAL ENTRY) - Abnormal; Notable for the following components:      Result Value   Clarity, UA cloudy (*)    Blood, UA small (*)    Leukocytes,  UA Trace (*)    All other components within normal limits  CERVICOVAGINAL ANCILLARY ONLY    EKG   Radiology No results found.  Procedures Procedures (including critical care time)  Medications Ordered in UC Medications - No data to display  Initial Impression / Assessment and Plan / UC Course  I have reviewed the triage vital signs and the nursing notes.  Pertinent labs & imaging results that were available during my care of the patient were reviewed by me and considered in my medical decision making (see chart for details).    I do not see evidence of UTI and hx is not consistent with UTI. I suspect she may have vulvar/vaginal irritation that is causing burning symptoms. Yeast could be a culprit. Pt elected vaginal swab testing for yeast and STIs, declined RPR and HIV testing. Will treat for possible yeast infection. Another possible cause of sx is loss of estrogen if in perimenopause period that could be causing vulvovaginal irritation and if treatment for yeast does not resolve sx, I recommend pt seek f/u and consider vaginal estrogen cream.   Final Clinical Impressions(s) / UC Diagnoses   Final diagnoses:  Vulvar irritation     Discharge Instructions      I also recommend purchasing miconazole vaginal cream - used for vaginal yeast infections - and using the cream on the areas that are irritated as needed.   You will get a call if tests are positive, you will not get a call if tests are negative but you can check results in MyChart if you have a MyChart account.     ED Prescriptions     Medication Sig Dispense Auth. Provider   fluconazole (DIFLUCAN) 150 MG tablet Take 1 tablet (150 mg total) by mouth daily. 1 tablet Cathlyn Parsons, NP      PDMP not reviewed this encounter.   Cathlyn Parsons, NP 03/17/23 1122

## 2023-03-20 LAB — CERVICOVAGINAL ANCILLARY ONLY
Bacterial Vaginitis (gardnerella): NEGATIVE
Candida Glabrata: NEGATIVE
Candida Vaginitis: POSITIVE — AB
Chlamydia: NEGATIVE
Comment: NEGATIVE
Comment: NEGATIVE
Comment: NEGATIVE
Comment: NEGATIVE
Comment: NEGATIVE
Comment: NORMAL
Neisseria Gonorrhea: NEGATIVE
Trichomonas: NEGATIVE

## 2024-03-06 ENCOUNTER — Emergency Department (HOSPITAL_COMMUNITY)
Admission: EM | Admit: 2024-03-06 | Discharge: 2024-03-06 | Disposition: A | Discharge status: Home / Self Care | Payer: Self-pay | Attending: Emergency Medicine | Admitting: Emergency Medicine

## 2024-03-06 ENCOUNTER — Emergency Department (HOSPITAL_COMMUNITY): Payer: Self-pay

## 2024-03-06 ENCOUNTER — Encounter (HOSPITAL_COMMUNITY): Payer: Self-pay

## 2024-03-06 DIAGNOSIS — D509 Iron deficiency anemia, unspecified: Secondary | ICD-10-CM | POA: Insufficient documentation

## 2024-03-06 DIAGNOSIS — R011 Cardiac murmur, unspecified: Secondary | ICD-10-CM | POA: Insufficient documentation

## 2024-03-06 DIAGNOSIS — M7989 Other specified soft tissue disorders: Secondary | ICD-10-CM | POA: Insufficient documentation

## 2024-03-06 DIAGNOSIS — R600 Localized edema: Secondary | ICD-10-CM | POA: Insufficient documentation

## 2024-03-06 LAB — CBC WITH DIFFERENTIAL/PLATELET
Abs Immature Granulocytes: 0.01 K/uL (ref 0.00–0.07)
Basophils Absolute: 0 K/uL (ref 0.0–0.1)
Basophils Relative: 0 %
Eosinophils Absolute: 0.2 K/uL (ref 0.0–0.5)
Eosinophils Relative: 3 %
HCT: 25.4 % — ABNORMAL LOW (ref 36.0–46.0)
Hemoglobin: 6.8 g/dL — CL (ref 12.0–15.0)
Immature Granulocytes: 0 %
Lymphocytes Relative: 27 %
Lymphs Abs: 1.5 K/uL (ref 0.7–4.0)
MCH: 17.4 pg — ABNORMAL LOW (ref 26.0–34.0)
MCHC: 26.8 g/dL — ABNORMAL LOW (ref 30.0–36.0)
MCV: 65 fL — ABNORMAL LOW (ref 80.0–100.0)
Monocytes Absolute: 0.4 K/uL (ref 0.1–1.0)
Monocytes Relative: 7 %
Neutro Abs: 3.5 K/uL (ref 1.7–7.7)
Neutrophils Relative %: 63 %
Platelets: 347 K/uL (ref 150–400)
RBC: 3.91 MIL/uL (ref 3.87–5.11)
RDW: 20.8 % — ABNORMAL HIGH (ref 11.5–15.5)
WBC: 5.6 K/uL (ref 4.0–10.5)
nRBC: 0.5 % — ABNORMAL HIGH (ref 0.0–0.2)

## 2024-03-06 LAB — FOLATE: Folate: 7.8 ng/mL (ref >5.9)

## 2024-03-06 LAB — BASIC METABOLIC PANEL WITH GFR
Anion gap: 9 (ref 5–15)
BUN: 7 mg/dL (ref 6–20)
CO2: 26 mmol/L (ref 22–32)
Calcium: 8.3 mg/dL — ABNORMAL LOW (ref 8.9–10.3)
Chloride: 104 mmol/L (ref 98–111)
Creatinine, Ser: 0.65 mg/dL (ref 0.44–1.00)
GFR, Estimated: >60 mL/min (ref >60)
Glucose, Bld: 104 mg/dL — ABNORMAL HIGH (ref 70–99)
Potassium: 3.6 mmol/L (ref 3.5–5.1)
Sodium: 139 mmol/L (ref 135–145)

## 2024-03-06 LAB — IRON AND TIBC
Iron: 14 ug/dL — ABNORMAL LOW (ref 28–170)
Saturation Ratios: 3 % — ABNORMAL LOW (ref 10.4–31.8)
TIBC: 424 ug/dL (ref 250–450)
UIBC: 410 ug/dL

## 2024-03-06 LAB — ABO/RH: ABO/RH(D): A POS

## 2024-03-06 LAB — TROPONIN I (HIGH SENSITIVITY): Troponin I (High Sensitivity): 16 ng/L (ref <18)

## 2024-03-06 LAB — HEPATIC FUNCTION PANEL
ALT: 12 U/L (ref 0–44)
AST: 14 U/L — ABNORMAL LOW (ref 15–41)
Albumin: 3.2 g/dL — ABNORMAL LOW (ref 3.5–5.0)
Alkaline Phosphatase: 78 U/L (ref 38–126)
Bilirubin, Direct: <0.1 mg/dL (ref 0.0–0.2)
Total Bilirubin: 0.4 mg/dL (ref 0.0–1.2)
Total Protein: 7.3 g/dL (ref 6.5–8.1)

## 2024-03-06 LAB — RETICULOCYTES
Immature Retic Fract: 28.9 % — ABNORMAL HIGH (ref 2.3–15.9)
RBC.: 4.05 MIL/uL (ref 3.87–5.11)
Retic Count, Absolute: 69.7 K/uL (ref 19.0–186.0)
Retic Ct Pct: 1.7 % (ref 0.4–3.1)

## 2024-03-06 LAB — VITAMIN B12: Vitamin B-12: 275 pg/mL (ref 180–914)

## 2024-03-06 LAB — FERRITIN: Ferritin: 3 ng/mL — ABNORMAL LOW (ref 11–307)

## 2024-03-06 LAB — BRAIN NATRIURETIC PEPTIDE: B Natriuretic Peptide: 19.4 pg/mL (ref 0.0–100.0)

## 2024-03-06 LAB — HEMOGLOBIN AND HEMATOCRIT, BLOOD
HCT: 26.1 % — ABNORMAL LOW (ref 36.0–46.0)
Hemoglobin: 7 g/dL — ABNORMAL LOW (ref 12.0–15.0)

## 2024-03-06 LAB — TYPE AND SCREEN
ABO/RH(D): A POS
Antibody Screen: NEGATIVE

## 2024-03-06 MED ORDER — FUROSEMIDE 20 MG PO TABS: 20.0000 mg | ORAL_TABLET | Freq: Every day | ORAL | 0 refills | Status: AC

## 2024-03-06 MED ORDER — FERROUS SULFATE 325 (65 FE) MG PO TABS: 325.0000 mg | ORAL_TABLET | Freq: Every day | ORAL | 1 refills | Status: AC

## 2024-03-06 MED ORDER — DOCUSATE SODIUM 100 MG PO CAPS: 100.0000 mg | ORAL_CAPSULE | Freq: Two times a day (BID) | ORAL | 0 refills | Status: AC

## 2024-03-06 NOTE — ED Provider Notes (Signed)
 [place] EMERGENCY DEPARTMENT AT [facility] Provider Note   CSN: [id number] Arrival date & time: 03/06/24  9757     Patient presents with: Leg Swelling   Shirley Everett is a 48 y.o. female.   The patient is a female presenting with swelling in the ankles and feet that began on Friday or Saturday. She denies any previous episodes of similar swelling. The patient reports no associated shortness of breath or changes in urination. She has a history of pregnancy-related heart failure approximately 22 years ago, which resolved postpartum without medication. She denies any known heart or kidney issues currently. The patient has a history of heavy menstrual periods lasting about seven days, with the last period occurring in September. She has experienced low hemoglobin levels in the past, requiring a blood transfusion five years ago when it was particularly low. She denies taking any medications, including those for regulating her menstrual cycle. There is no known history of hypertension or heart murmurs prior to this visit. History was obtained from the patient.        Prior to Admission medications   Medication Sig Start Date End Date Taking? Authorizing Provider  docusate sodium (COLACE) 100 MG capsule Take 1 capsule (100 mg total) by mouth every 12 (twelve) hours. 03/06/24  Yes Rashonda Warrior, Selinda, MD  ferrous sulfate 325 (65 FE) MG tablet Take 1 tablet (325 mg total) by mouth daily. 03/06/24  Yes Jorge Retz, Selinda, MD  furosemide (LASIX) 20 MG tablet Take 1 tablet (20 mg total) by mouth daily for 5 days. 03/06/24 03/11/24 Yes Temiloluwa Recchia, Selinda, MD  fluconazole  (DIFLUCAN ) 150 MG tablet Take 1 tablet (150 mg total) by mouth daily. 03/17/23   Richad Jon HERO, NP  methocarbamol  (ROBAXIN ) 500 MG tablet Take 1 tablet (500 mg total) by mouth 2 (two) times daily. 07/15/13   Harris, Abigail, PA-C  naproxen  (NAPROSYN ) 500 MG tablet Take 1 tablet (500 mg total) by mouth 2 (two) times daily with a meal.  07/15/13   Arloa Chroman, PA-C  PRESCRIPTION MEDICATION Take 1 tablet by mouth daily.    [provider]    Allergies: Patient has no known allergies.    Review of Systems  Updated Vital Signs BP 114/64   Pulse 76   Temp 98.1 F (36.7 C) (Oral)   Resp 18   SpO2 100%   Physical Exam Vitals and nursing note reviewed.  Constitutional:      Appearance: She is well-developed.  HENT:     Head: Normocephalic and atraumatic.  Cardiovascular:     Rate and Rhythm: Normal rate and regular rhythm.     Heart sounds: Murmur heard.     Systolic murmur is present.  Pulmonary:     Effort: No respiratory distress.     Breath sounds: No stridor.  Abdominal:     General: There is no distension.  Musculoskeletal:     Cervical back: Normal range of motion.  Neurological:     Mental Status: She is alert.     (all labs ordered are listed, but only abnormal results are displayed) Labs Reviewed  BASIC METABOLIC PANEL WITH GFR - Abnormal; Notable for the following components:      Result Value   Glucose, Bld 104 (*)    Calcium 8.3 (*)    All other components within normal limits  CBC WITH DIFFERENTIAL/PLATELET - Abnormal; Notable for the following components:   Hemoglobin 6.8 (*)    HCT 25.4 (*)    MCV  65.0 (*)    MCH 17.4 (*)    MCHC 26.8 (*)    RDW 20.8 (*)    nRBC 0.5 (*)    All other components within normal limits  HEPATIC FUNCTION PANEL - Abnormal; Notable for the following components:   Albumin 3.2 (*)    AST 14 (*)    All other components within normal limits  HEMOGLOBIN AND HEMATOCRIT, BLOOD - Abnormal; Notable for the following components:   Hemoglobin 7.0 (*)    HCT 26.1 (*)    All other components within normal limits  IRON AND TIBC - Abnormal; Notable for the following components:   Iron 14 (*)    Saturation Ratios 3 (*)    All other components within normal limits  FERRITIN - Abnormal; Notable for the following components:   Ferritin 3 (*)    All other  components within normal limits  RETICULOCYTES - Abnormal; Notable for the following components:   Immature Retic Fract 28.9 (*)    All other components within normal limits  BRAIN NATRIURETIC PEPTIDE  VITAMIN B12  FOLATE  TYPE AND SCREEN  ABO/RH  TROPONIN I (HIGH SENSITIVITY)    EKG: None  Radiology: DG Chest 2 View Result Date: 03/06/2024 CLINICAL DATA:  Lower extremity edema. EXAM: CHEST - 2 VIEW COMPARISON:  07/13/2005 FINDINGS: The heart size and mediastinal contours are within normal limits. Both lungs are clear. The visualized skeletal structures are unremarkable. IMPRESSION: No active cardiopulmonary disease. Electronically Signed   By: Norleen DELENA Kil M.D.   On: 03/06/2024 06:12     Procedures   Medications Ordered in the ED - No data to display                                  Medical Decision Making Amount and/or Complexity of Data Reviewed Labs: ordered. Radiology: ordered. ECG/medicine tests: ordered.  Risk OTC drugs. Prescription drug management.  New heart murmur with anemia.  Suspect anemia is probably chronic although I cannot compare to any of her old ones she is asymptomatic and her vital signs are within normal limits.  She does have heavy vaginal bleeding and just finished a very heavy period a couple weeks ago.  Although since she has a new murmur does not been documented before, will add on anemia panel to further evaluate.  Renal function is good.  BNP is good.  Low suspicion for cardiac causes but will need ultrasound either way and a cardiology consult will likely be placed if she is discharged. Patient's anemia panel is consistent with iron deficiency anemia.  Will start her on iron.  She is not symptomatic because she is borderline needing transfused so we decided not to transfuse her.  She will follow-up with cardiology for the new heart murmur and lower extremity swelling we will trial a 5-day course of Lasix to see if that helps.  She also follow-up  with hematology to follow her hemoglobin levels and to institute iron infusions if necessary or blood transfusions.  Overall patient appears well is vitally stable for discharge.   Final diagnoses:  Foot swelling  Iron deficiency anemia, unspecified iron deficiency anemia type  Heart murmur    ED Discharge Orders          Ordered    furosemide (LASIX) 20 MG tablet  Daily        03/06/24 0707    ferrous sulfate 325 (65 FE)  MG tablet  Daily        03/06/24 0707    docusate sodium (COLACE) 100 MG capsule  Every 12 hours        03/06/24 0707    Ambulatory referral to Hematology / Oncology        03/06/24 0707    Ambulatory referral to Cardiology        03/06/24 0707               Kaysen Deal, Selinda, MD 03/06/24 [phone]

## 2024-03-06 NOTE — ED Triage Notes (Signed)
 Pt is coming in for leg swelling that is occurred bilaterally since Friday/Saturday. No leg pain, no known heart failure other than having heart failure during her pregnancy 20 years ago. She is otherwise stable with no chest pain or shortness of breath.

## 2024-03-06 NOTE — ED Notes (Signed)
 Discharge paper work reviewed with pt. Pt in no new onset distress at this time. Pt is leaving for lobby saying she wishes to call an uber.
